# Patient Record
Sex: Male | Born: 1955 | Race: White | Hispanic: No | Marital: Married | Smoking: Former smoker
Health system: Southern US, Community
[De-identification: ages and names within clinical notes are randomized; demographics above are authoritative.]

---

## 1997-10-29 ENCOUNTER — Ambulatory Visit (HOSPITAL_COMMUNITY): Admission: RE | Admit: 1997-10-29 | Discharge: 1997-10-29 | Payer: Self-pay | Admitting: Neurosurgery

## 2004-09-21 ENCOUNTER — Encounter: Admission: RE | Admit: 2004-09-21 | Discharge: 2004-09-21 | Payer: Self-pay | Admitting: Neurosurgery

## 2004-11-16 ENCOUNTER — Encounter: Admission: RE | Admit: 2004-11-16 | Discharge: 2004-11-16 | Payer: Self-pay | Admitting: Neurosurgery

## 2005-11-27 ENCOUNTER — Ambulatory Visit (HOSPITAL_COMMUNITY): Admission: RE | Admit: 2005-11-27 | Discharge: 2005-11-27 | Payer: Self-pay | Admitting: Neurosurgery

## 2006-06-12 ENCOUNTER — Encounter: Admission: RE | Admit: 2006-06-12 | Discharge: 2006-06-12 | Payer: Self-pay | Admitting: Neurosurgery

## 2007-01-14 ENCOUNTER — Encounter: Admission: RE | Admit: 2007-01-14 | Discharge: 2007-01-14 | Payer: Self-pay | Admitting: Neurosurgery

## 2010-04-16 ENCOUNTER — Encounter: Payer: Self-pay | Admitting: Neurosurgery

## 2017-07-02 ENCOUNTER — Other Ambulatory Visit: Payer: Self-pay | Admitting: Family Medicine

## 2017-07-02 DIAGNOSIS — I739 Peripheral vascular disease, unspecified: Secondary | ICD-10-CM

## 2017-07-05 ENCOUNTER — Ambulatory Visit
Admission: RE | Admit: 2017-07-05 | Discharge: 2017-07-05 | Disposition: A | Discharge status: Home / Self Care | Payer: No Typology Code available for payment source | Source: Ambulatory Visit | Attending: Family Medicine | Admitting: Family Medicine

## 2017-07-05 DIAGNOSIS — I739 Peripheral vascular disease, unspecified: Secondary | ICD-10-CM

## 2017-07-08 ENCOUNTER — Ambulatory Visit
Admission: RE | Admit: 2017-07-08 | Discharge: 2017-07-08 | Disposition: A | Discharge status: Home / Self Care | Payer: Self-pay | Source: Ambulatory Visit | Attending: Family Medicine | Admitting: Family Medicine

## 2017-07-08 DIAGNOSIS — I739 Peripheral vascular disease, unspecified: Secondary | ICD-10-CM

## 2017-07-29 DIAGNOSIS — I739 Peripheral vascular disease, unspecified: Secondary | ICD-10-CM

## 2017-07-29 NOTE — H&P (Signed)
Zachary Spears 07/11/2017 2:00 PM Location: [facility] Patient #: [id number] DOB: November 21, 1955 Married / Language: English / Race: White Male   History of Present Illness Zachary Maine FNP-C; 07/11/2017 3:18 PM) Patient words: NP EVAL for L leg arteral blockage.  The patient is a 62 year old male who presents with peripheral vascular disease. [name] is a 62 year old Caucasian male referred by his PCP for evaluation of abnormal lower extremity arterial duplex. Patient has past medical history of sleep apnea on CPAP. Denies any hypertension, hyperlipidemia, diabetes, or thyroid disorders. He is also recurrent 1-1/2 pack-a-day smoker for the last 40 years. He reports over the last year he has had cramping in his left calf with walking and now he is only able to walk 1-200 feet without having to stop to rest due to left leg pain. He recently underwent lower extremity arterial duplex that revealed a left ABI of 0.4, right ABI was normal at 1.0. No ulcerations have been noted or color changes. He reports previously having a stress test done several years ago that he states was normal. He denies any symptoms of chest pain, shortness of breath, edema, PND, orthopnea, or symptoms suggestive of TIA. He was recently given a prescription for Chantix and has recently started this to help aid in his smoking cessation.   Problem List/Past Medical (April Harrington; 07/11/2017 2:10 PM) Laboratory examination (Z01.89)  Blocked artery (I70.90)  lt leg Sleep apnea (G47.30)  OSA on CPAP (G47.33)   Allergies (April Harrington; 07/11/2017 2:03 PM) No Known Drug Allergies [07/11/2017]:  Family History (April Harrington; 07/11/2017 2:07 PM) Mother  In good health. no heart attacks or strokes, no known cardiovascular conditions Father  Deceased. age 31 from pneumonia, cancer Sister 1  younger-good health Brother 1  yoounger-good health, no heart attacks or strokes, no known  cardiovascular conditions  Social History (April Harrington; 07/11/2017 2:04 PM) Current tobacco use  Current every day smoker. 1ppd for about 40 yrs Alcohol Use  Occasional alcohol use, Drinks beer. Marital status  Married. Living Situation  Lives with spouse. Number of Children  2.  Past Surgical History (April Harrington; 07/11/2017 2:06 PM) Stomach Surgery  in the 1970's  Medication History (April Harrington; 07/11/2017 2:14 PM) Ibuprofen (200MG Tablet, Oral prn for knee pain) Active. Medications Reconciled (verbally with pt; no list or medication present)  Diagnostic Studies History (April Harrington; 07/11/2017 2:12 PM) Colonoscopy [2000]: Endoscopy  1970's Sleep Study  late 1980's-sleep apnea w/ cpap Treadmill stress test [2001]: Nuclear stress test [2001]: MRI  Back-about 15 yrs ago    Review of Systems Zachary Maine FNP-C; 07/11/2017 3:15 PM) General Not Present- Appetite Loss and Weight Gain. Respiratory Not Present- Chronic Cough and Wakes up from Sleep Wheezing or Short of Breath. Cardiovascular Present- Claudications (progressively worsening over the last year in left calf). Not Present- Chest Pain, Difficulty Breathing Lying Down, Difficulty Breathing On Exertion, Edema and Orthopnea. Gastrointestinal Not Present- Black, Tarry Stool and Difficulty Swallowing. Musculoskeletal Not Present- Decreased Range of Motion and Muscle Atrophy. Neurological Not Present- Attention Deficit. Psychiatric Not Present- Personality Changes and Suicidal Ideation. Endocrine Not Present- Cold Intolerance and Heat Intolerance. Hematology Not Present- Abnormal Bleeding. All other systems negative  Vitals (April Harrington; 07/11/2017 2:14 PM) 07/11/2017 1:58 PM Weight: 289.25 lb Height: 70in Body Surface Area: 2.44 m Body Mass Index: 41.5 kg/m  Pulse: 78 (Regular)  P.OX: 96% (Room air) BP: 140/72 (Sitting, Left Arm, Standard)       Physical  Exam Zachary Maine FNP-C; 07/11/2017 3:14 PM) General Mental Status-Alert. General Appearance-Cooperative, Appears stated age, Not in acute distress. Build & Nutrition-Well built and Morbidly obese.  Head and Neck Neck -Note: Short neck and difficult to evaluate JVD.  Thyroid Gland Characteristics - no palpable nodules, no palpable enlargement.  Cardiovascular Cardiovascular examination reveals -normal heart sounds, regular rate and rhythm with no murmurs. Inspection Jugular vein - Right - No Distention.  Abdomen Inspection Contour - Obese and Pannus present. Palpation/Percussion Palpation and Percussion of the abdomen reveal - Non Tender and No hepatosplenomegaly. Auscultation Auscultation of the abdomen reveals - Bowel sounds normal.  Peripheral Vascular Lower Extremity Inspection - Bilateral - No Cyanotic nailbeds, No Ulcerations. Palpation - Temperature - Left - Cool. Right - Warm. Edema - Bilateral - No edema. Femoral pulse - Bilateral - Feeble(Pulsus difficult to feel due to patient's bodily habitus.), No Bruits. Popliteal pulse - Bilateral - Feeble(Pulsus difficult to feel due to patient's bodily habitus.). Dorsalis pedis pulse - Left - Absent. Right - 1+. Posterior tibial pulse - Left - 0+. Right - 2+. Carotid arteries - Left-Soft Bruit. Carotid arteries - Right-No Carotid bruit.  Neurologic Neurologic evaluation reveals -alert and oriented x 3 with no impairment of recent or remote memory. Motor-Grossly intact without any focal deficits.  Musculoskeletal Global Assessment Left Lower Extremity - normal range of motion without pain. Right Lower Extremity - normal range of motion without pain.    Assessment & Plan Zachary Maine FNP-C; 07/11/2017 3:21 PM) Intermittent claudication of left lower extremity due to atherosclerosis (I70.212) Story: Bilateral LE duplex 07/08/2017: L ABI 0.45, R ABI: 1.0; Segmental pressures, arterial wave forms, and pulse volume  recordings suggested combination of In-Flow (iliac) and outflow (femoropopliteal) occlusive disease.  EKG 07/11/2017: Normal sinus rhythm at 65 bpm,left atrial enlargement, normal axis, poor R-wave progression cannot exclude anterior infarct old. Low-voltage complexes. No evidence of ischemia. Current Plans Complete electrocardiogram (93000) Started Crestor 20MG, 1 (one) Tablet every evening after dinner, #30, 30 days starting 07/11/2017, Ref. x1. Started Clopidogrel Bisulfate 75MG, 1 (one) Tablet daily, #30, 30 days starting 07/11/2017, Ref. x1. Started Aspirin 81 81MG, 1 (one) Tablet daily, #30, 30 days starting 07/11/2017, No Refill. Started Altace 5MG, 1 (one) Capsule daily in the evening, #30, 30 days starting 07/11/2017, Ref. x1. Left carotid bruit (R09.89) Tobacco use (Z72.0) Story: 80 pack year history; currently working to quit smoking with Chantix Current Plans Started Nicotine Step 1 21MG/24HR, 1 (one) Patch daily for 6 weeks, 30 days starting 07/11/2017, Ref. x1. Local Order: 21 mg for 6 weeks; ;then 14 mg for 2 weeks; then 7 mg for 2 weeks Started Nicotine Step 2 14MG/24HR, 1 (one) Patch daily for 2 weeks, 10 days starting 07/11/2017, No Refill. Local Order: after 21 mg then 7 mg for 2 weeks Started Nicotine Step 3 7MG/24HR, 1 (one) Patch daily for 2 weeks, 10 Patch, 10 days starting 07/11/2017, No Refill. Local Order: after 50m patch Critical lower limb ischemia (I99.8) Laboratory examination ((Y70.62 Story: 07/02/2017: Cholesterol 211, triglycerides 207, HDL 43, LDL 134. Creatinine 0.95, EGFR 85/99, potassium 4.3, CMP normal. Hemoglobin A1c 5.6%. TSH 1.69. CBC normal. Iron studies normal.  Note:. Recommendation:  Patient is referred to [facility] his PCP for evaluation of abnormal lower extremity arterial duplex. Patient has classic claudication symptoms class III and critical limb ischemia and by physical exam. He will need peripheral angiogram with possible intervention.  Although he is without symptoms of coronary ischemia, will need evaluation  for the same given his risk factors and significant PAD. Will schedule for Lexiscan nuclear stress testing prior to procedure to exclude coronary ischemia prior to undergoing the procedure. Unless stress testing is abnormal, we will proceed with the procedure. He will also need carotid duplex as he has right carotid bruit. I have started him on Plavix and daily baby aspirin for management of his PAD. We'll also start him on Altase 5 mg daily for blood pressure management. Given his significant PAD, he will need statin therapy, I have started him on Crestor 20 mg daily. I have advised him to only use ibuprofen as needed due to increased GI bleeding risk.  Lengthy discussion with the patient regarding the importance of smoking cessation and how it is contributed to his PAD He is currently using Chantix to aid with this. I've advised him to start nicotine patches as well as patients tend to have better success with dual medical therapy. He is also morbidly obese and will need significant diet modifications to promote weight loss. I discussed the importance of caloric restriction. We'll see him back after the procedure for further recommendations and evaluation.  *I have discussed this case with Dr. Einar Gip and he personally examined the patient and participated in formulating the plan.*  CC: Dr. Tamsen Roers    Signed by Zachary Maine, FNP-C (07/11/2017 3:22 PM)

## 2017-07-29 NOTE — Progress Notes (Signed)
Labs04/30/2019:  H/H 14.8/42. MCV 90. Platelets 269.  Glucose 106, creatinine 0.94, EGFR 87/100, potassium 4.7, BMP otherwise normal.

## 2017-07-30 ENCOUNTER — Ambulatory Visit (HOSPITAL_COMMUNITY)
Admission: RE | Disposition: A | Discharge status: Home / Self Care | Payer: Self-pay | Source: Ambulatory Visit | Attending: Cardiology

## 2017-07-30 ENCOUNTER — Ambulatory Visit (HOSPITAL_COMMUNITY)
Admission: RE | Admit: 2017-07-30 | Discharge: 2017-07-30 | Disposition: A | Discharge status: Home / Self Care | Payer: Self-pay | Source: Ambulatory Visit | Attending: Cardiology | Admitting: Cardiology

## 2017-07-30 ENCOUNTER — Encounter (HOSPITAL_COMMUNITY): Payer: Self-pay | Admitting: Cardiology

## 2017-07-30 DIAGNOSIS — F17210 Nicotine dependence, cigarettes, uncomplicated: Secondary | ICD-10-CM | POA: Insufficient documentation

## 2017-07-30 DIAGNOSIS — Z79899 Other long term (current) drug therapy: Secondary | ICD-10-CM | POA: Insufficient documentation

## 2017-07-30 DIAGNOSIS — I739 Peripheral vascular disease, unspecified: Secondary | ICD-10-CM

## 2017-07-30 DIAGNOSIS — Z6841 Body Mass Index (BMI) 40.0 and over, adult: Secondary | ICD-10-CM | POA: Insufficient documentation

## 2017-07-30 DIAGNOSIS — I70212 Atherosclerosis of native arteries of extremities with intermittent claudication, left leg: Secondary | ICD-10-CM | POA: Insufficient documentation

## 2017-07-30 DIAGNOSIS — Z9889 Other specified postprocedural states: Secondary | ICD-10-CM | POA: Insufficient documentation

## 2017-07-30 DIAGNOSIS — G4733 Obstructive sleep apnea (adult) (pediatric): Secondary | ICD-10-CM | POA: Insufficient documentation

## 2017-07-30 HISTORY — PX: LOWER EXTREMITY ANGIOGRAPHY: CATH118251

## 2017-07-30 HISTORY — PX: ABDOMINAL AORTOGRAM: CATH118222

## 2017-07-30 HISTORY — PX: PERIPHERAL VASCULAR ATHERECTOMY: CATH118256

## 2017-07-30 LAB — POCT ACTIVATED CLOTTING TIME
ACTIVATED CLOTTING TIME: 252 s
ACTIVATED CLOTTING TIME: 241 s
Activated Clotting Time: 235 seconds
Activated Clotting Time: 246 seconds

## 2017-07-30 SURGERY — LOWER EXTREMITY ANGIOGRAPHY
Anesthesia: LOCAL

## 2017-07-30 MED ORDER — CLOPIDOGREL BISULFATE 300 MG PO TABS
ORAL_TABLET | ORAL | Status: AC
  Filled 2017-07-30: qty 2

## 2017-07-30 MED ORDER — FENTANYL CITRATE (PF) 100 MCG/2ML IJ SOLN
INTRAMUSCULAR | Status: AC
  Filled 2017-07-30: qty 2

## 2017-07-30 MED ORDER — VERAPAMIL HCL 2.5 MG/ML IV SOLN
INTRAVENOUS | Status: AC
  Filled 2017-07-30: qty 2

## 2017-07-30 MED ORDER — NITROGLYCERIN 1 MG/10 ML FOR IR/CATH LAB
INTRA_ARTERIAL | Status: DC | PRN
  Administered 2017-07-30: 300 ug
  Administered 2017-07-30 (×2): 200 ug
  Administered 2017-07-30: 300 mL
  Administered 2017-07-30: 200 ug
  Administered 2017-07-30: 400 ug
  Administered 2017-07-30: 300 ug

## 2017-07-30 MED ORDER — MIDAZOLAM HCL 2 MG/2ML IJ SOLN
INTRAMUSCULAR | Status: AC
  Filled 2017-07-30: qty 2

## 2017-07-30 MED ORDER — SODIUM CHLORIDE 0.9 % IV SOLN: 250.0000 mL | INTRAVENOUS | Status: DC | PRN

## 2017-07-30 MED ORDER — HEPARIN (PORCINE) IN NACL 1000-0.9 UT/500ML-% IV SOLN
INTRAVENOUS | Status: AC
  Filled 2017-07-30: qty 1000

## 2017-07-30 MED ORDER — LIDOCAINE HCL (PF) 1 % IJ SOLN
INTRAMUSCULAR | Status: DC | PRN
  Administered 2017-07-30: 15 mL

## 2017-07-30 MED ORDER — HEPARIN SODIUM (PORCINE) 1000 UNIT/ML IJ SOLN
INTRAMUSCULAR | Status: DC | PRN
  Administered 2017-07-30 (×2): 3000 [IU] via INTRAVENOUS
  Administered 2017-07-30: 2000 [IU] via INTRAVENOUS
  Administered 2017-07-30: 3000 [IU] via INTRAVENOUS
  Administered 2017-07-30: 10000 [IU] via INTRAVENOUS

## 2017-07-30 MED ORDER — NITROGLYCERIN IN D5W 200-5 MCG/ML-% IV SOLN
INTRAVENOUS | Status: AC
  Filled 2017-07-30: qty 250

## 2017-07-30 MED ORDER — ACETAMINOPHEN 325 MG PO TABS: 650.0000 mg | ORAL_TABLET | ORAL | Status: DC | PRN

## 2017-07-30 MED ORDER — FENTANYL CITRATE (PF) 100 MCG/2ML IJ SOLN
INTRAMUSCULAR | Status: DC | PRN
  Administered 2017-07-30 (×4): 50 ug via INTRAVENOUS
  Administered 2017-07-30: 25 ug via INTRAVENOUS

## 2017-07-30 MED ORDER — HEPARIN SODIUM (PORCINE) 1000 UNIT/ML IJ SOLN
INTRAMUSCULAR | Status: AC
  Filled 2017-07-30: qty 1

## 2017-07-30 MED ORDER — LABETALOL HCL 5 MG/ML IV SOLN: 10.0000 mg | INTRAVENOUS | Status: DC | PRN

## 2017-07-30 MED ORDER — HYDRALAZINE HCL 20 MG/ML IJ SOLN: 5.0000 mg | INTRAMUSCULAR | Status: DC | PRN

## 2017-07-30 MED ORDER — SODIUM CHLORIDE 0.9% FLUSH: 3.0000 mL | INTRAVENOUS | Status: DC | PRN

## 2017-07-30 MED ORDER — FENTANYL CITRATE (PF) 100 MCG/2ML IJ SOLN
INTRAMUSCULAR | Status: AC
Start: 2017-07-30 — End: ?
  Filled 2017-07-30: qty 2

## 2017-07-30 MED ORDER — SODIUM CHLORIDE 0.9% FLUSH: 3.0000 mL | Freq: Two times a day (BID) | INTRAVENOUS | Status: DC

## 2017-07-30 MED ORDER — HEPARIN (PORCINE) IN NACL 2-0.9 UNITS/ML
INTRAMUSCULAR | Status: AC | PRN
  Administered 2017-07-30 (×2): 500 mL

## 2017-07-30 MED ORDER — NITROGLYCERIN 0.4 MG SL SUBL
SUBLINGUAL_TABLET | SUBLINGUAL | Status: AC
  Filled 2017-07-30: qty 1

## 2017-07-30 MED ORDER — CLOPIDOGREL BISULFATE 300 MG PO TABS
ORAL_TABLET | ORAL | Status: DC | PRN
  Administered 2017-07-30: 600 mg via ORAL

## 2017-07-30 MED ORDER — SODIUM CHLORIDE 0.9 % WEIGHT BASED INFUSION: 1.0000 mL/kg/h | INTRAVENOUS | Status: DC

## 2017-07-30 MED ORDER — MIDAZOLAM HCL 2 MG/2ML IJ SOLN
INTRAMUSCULAR | Status: DC | PRN
  Administered 2017-07-30: 1 mg via INTRAVENOUS
  Administered 2017-07-30: 2 mg via INTRAVENOUS

## 2017-07-30 MED ORDER — SODIUM CHLORIDE 0.9 % IV SOLN
INTRAVENOUS | Status: AC
  Administered 2017-07-30: 06:00:00 via INTRAVENOUS

## 2017-07-30 MED ORDER — ONDANSETRON HCL 4 MG/2ML IJ SOLN: 4.0000 mg | Freq: Four times a day (QID) | INTRAMUSCULAR | Status: DC | PRN

## 2017-07-30 MED ORDER — LIDOCAINE HCL (PF) 1 % IJ SOLN
INTRAMUSCULAR | Status: AC
  Filled 2017-07-30: qty 30

## 2017-07-30 MED ORDER — IODIXANOL 320 MG/ML IV SOLN
INTRAVENOUS | Status: DC | PRN
  Administered 2017-07-30: 215 mL via INTRAVENOUS

## 2017-07-30 SURGICAL SUPPLY — 35 items
BALLN COYOTE OTW 1.5X40X150 (BALLOONS) ×3
BALLN IN.PACT DCB 6X60 (BALLOONS) ×3
BALLN LUTONIX AV 8X40X75 (BALLOONS) ×3
BALLOON COYOTE OTW 1.5X40X150 (BALLOONS) ×2 IMPLANT
BALLOON LUTONIX AV 8X40X75 (BALLOONS) ×2 IMPLANT
CATH CXI SUPP ANG 2.6FR 150CM (CATHETERS) ×3 IMPLANT
CATH CXI SUPP ST 4FR 135CM (CATHETERS) ×3 IMPLANT
CATH OMNI FLUSH 5F 65CM (CATHETERS) ×3 IMPLANT
COVER PRB 48X5XTLSCP FOLD TPE (BAG) ×2 IMPLANT
COVER PROBE 5X48 (BAG) ×1
DCB IN.PACT 6X60 (BALLOONS) ×2 IMPLANT
DEVICE CLOSURE MYNXGRIP 6/7F (Vascular Products) ×3 IMPLANT
DEVICE EMBOSHIELD NAV6 4.0-7.0 (FILTER) ×3 IMPLANT
DIAMONDBACK SOLID OAS 2.0MM (CATHETERS) ×3
GLIDEWIRE NITREX 0.018X80X5 (WIRE) ×1
GUIDEWIRE ANGLED .035X150CM (WIRE) ×3 IMPLANT
GUIDEWIRE NITREX 0.018X80X5 (WIRE) ×2 IMPLANT
KIT ENCORE 26 ADVANTAGE (KITS) ×3 IMPLANT
KIT MICROPUNCTURE NIT STIFF (SHEATH) ×3 IMPLANT
KIT PV (KITS) ×3 IMPLANT
LUBRICANT VIPERSLIDE CORONARY (MISCELLANEOUS) ×6 IMPLANT
SHEATH PINNACLE 5F 10CM (SHEATH) ×3 IMPLANT
SHEATH PINNACLE 7F 10CM (SHEATH) ×3 IMPLANT
SHEATH PINNACLE ST 7F 45CM (SHEATH) ×3 IMPLANT
STOPCOCK MORSE 400PSI 3WAY (MISCELLANEOUS) ×3 IMPLANT
SYRINGE MEDRAD AVANTA MACH 7 (SYRINGE) ×3 IMPLANT
SYSTEM DIMNDBCK SLD OAS 2.0MM (CATHETERS) ×2 IMPLANT
TRANSDUCER W/STOPCOCK (MISCELLANEOUS) ×3 IMPLANT
TRAY PV CATH (CUSTOM PROCEDURE TRAY) ×3 IMPLANT
TUBING CIL FLEX 10 FLL-RA (TUBING) ×3 IMPLANT
WIRE HI TORQ COMMND ES.014X300 (WIRE) ×3 IMPLANT
WIRE HITORQ VERSACORE ST 145CM (WIRE) ×3 IMPLANT
WIRE TORQFLEX AUST .018X40CM (WIRE) ×3 IMPLANT
WIRE VERSACORE LOC 115CM (WIRE) ×3 IMPLANT
WIRE VIPER ADVANCE .017X335CM (WIRE) ×3 IMPLANT

## 2017-07-30 NOTE — Interval H&P Note (Signed)
History and Physical Interval Note:  07/30/2017 7:23 AM  Zachary Spears  has presented today for surgery, with the diagnosis of PAD  The various methods of treatment have been discussed with the patient and family. After consideration of risks, benefits and other options for treatment, the patient has consented to  Procedure(s): LOWER EXTREMITY ANGIOGRAPHY (Left) as a surgical intervention .  The patient's history has been reviewed, patient examined, no change in status, stable for surgery.  I have reviewed the patient's chart and labs.  Questions were answered to the patient's satisfaction.     Manish J Patwardhan

## 2017-07-30 NOTE — Discharge Instructions (Signed)
**Note Zachary Spears-identified via Obfuscation** Femoral Site Care °Refer to this sheet in the next few weeks. These instructions provide you with information about caring for yourself after your procedure. Your health care provider may also give you more specific instructions. Your treatment has been planned according to current medical practices, but problems sometimes occur. Call your health care provider if you have any problems or questions after your procedure. °What can I expect after the procedure? °After your procedure, it is typical to have the following: °· Bruising at the site that usually fades within 1-2 weeks. °· Blood collecting in the tissue (hematoma) that may be painful to the touch. It should usually decrease in size and tenderness within 1-2 weeks. ° °Follow these instructions at home: °· Take medicines only as directed by your health care provider. °· You may shower 24-48 hours after the procedure or as directed by your health care provider. Remove the bandage (dressing) and gently wash the site with plain soap and water. Pat the area dry with a clean towel. Do not rub the site, because this may cause bleeding. °· Do not take baths, swim, or use a hot tub until your health care provider approves. °· Check your insertion site every day for redness, swelling, or drainage. °· Do not apply powder or lotion to the site. °· Limit use of stairs to twice a day for the first 2-3 days or as directed by your health care provider. °· Do not squat for the first 2-3 days or as directed by your health care provider. °· Do not lift over 10 lb (4.5 kg) for 5 days after your procedure or as directed by your health care provider. °· Ask your health care provider when it is okay to: °? Return to work or school. °? Resume usual physical activities or sports. °? Resume sexual activity. °· Do not drive home if you are discharged the same day as the procedure. Have someone else drive you. °· You may drive 24 hours after the procedure unless otherwise instructed by  your health care provider. °· Do not operate machinery or power tools for 24 hours after the procedure or as directed by your health care provider. °· If your procedure was done as an outpatient procedure, which means that you went home the same day as your procedure, a responsible adult should be with you for the first 24 hours after you arrive home. °· Keep all follow-up visits as directed by your health care provider. This is important. °Contact a health care provider if: °· You have a fever. °· You have chills. °· You have increased bleeding from the site. Hold pressure on the site. °Get help right away if: °· You have unusual pain at the site. °· You have redness, warmth, or swelling at the site. °· You have drainage (other than a small amount of blood on the dressing) from the site. °· The site is bleeding, and the bleeding does not stop after 30 minutes of holding steady pressure on the site. °· Your leg or foot becomes pale, cool, tingly, or numb. °This information is not intended to replace advice given to you by your health care provider. Make sure you discuss any questions you have with your health care provider. °Document Released: 11/13/2013 Document Revised: 08/18/2015 Document Reviewed: 09/29/2013 °Elsevier Interactive Patient Education © 2018 [employer] Inc[employer] ° °

## 2017-07-30 NOTE — Progress Notes (Addendum)
**Note Leslee Suire-Identified via Obfuscation** Patient post lower extremity angiography. No complications with surgical site. Patient denies any pain or shortness of breath. Patient's urine very dark tea color. Dr. Jacinto Halim notified by this RN of patient's urine color/appearance. Patient instructed to drink plenty of water and follow-up with Dr. Jacinto Halim tomorrow if urine continues to be very dark tea colored. Patient and wife states understanding of discharge instructions. Fall/safety precautions implemented.

## 2017-07-31 MED FILL — Nitroglycerin IV Soln 200 MCG/ML in D5W: INTRAVENOUS | Qty: 250 | Status: AC

## 2017-07-31 MED FILL — Heparin Sod (Porcine)-NaCl IV Soln 1000 Unit/500ML-0.9%: INTRAVENOUS | Qty: 1000 | Status: AC

## 2017-07-31 MED FILL — Verapamil HCl IV Soln 2.5 MG/ML: INTRAVENOUS | Qty: 4 | Status: AC

## 2019-07-07 IMAGING — US US EXTREM LOW VENOUS*L*
1 series · 13 of 24 positions shown · non-contrast
Comparison: None.

CLINICAL DATA: Left lower extremity pain. History of smoking.
Evaluate for DVT.



[Series 1: us extrem low venous*left* · 0.07mm/px · 13 of 27 slices shown]
[im 1/27]
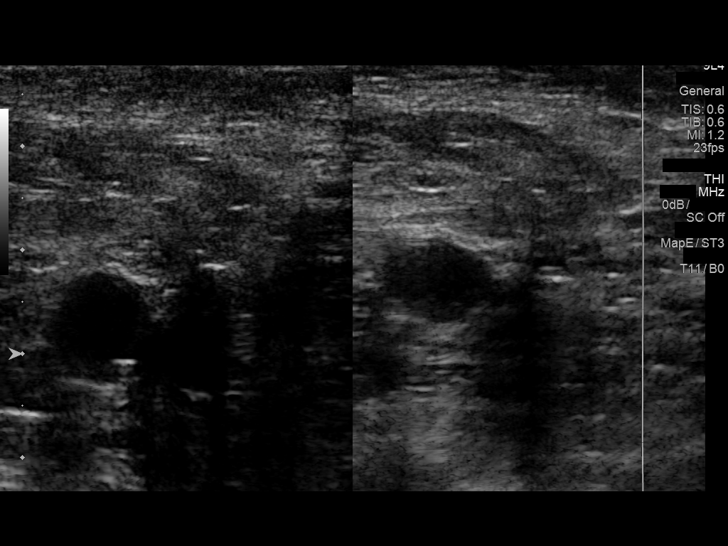
[im 3/27]
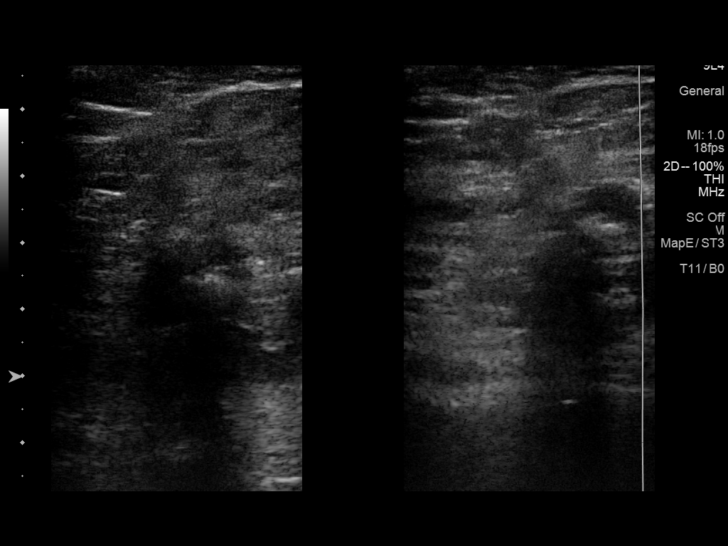
[im 5/27]
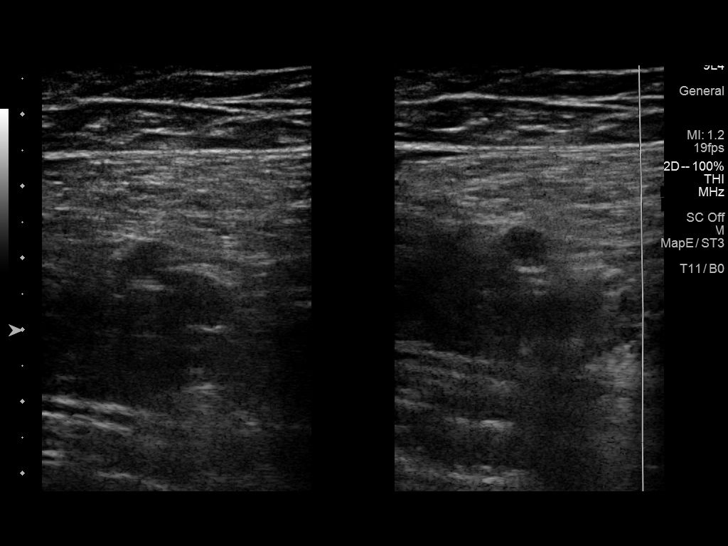
[im 7/27]
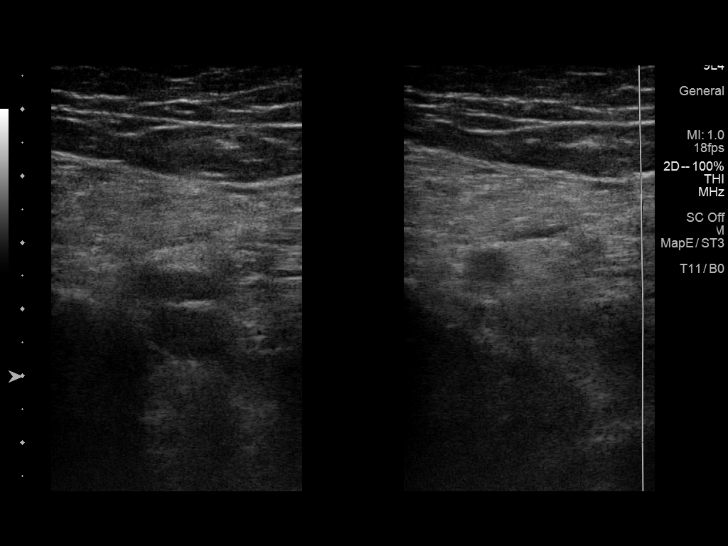
[im 10/27]
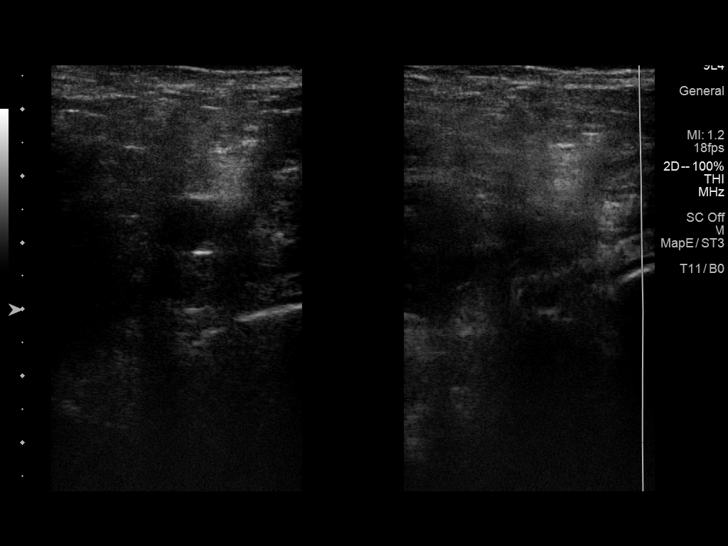
[im 12/27]
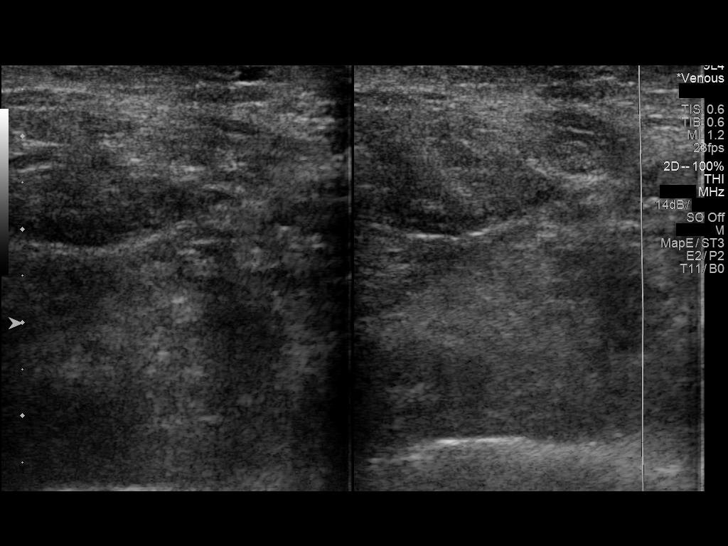
[im 14/27]
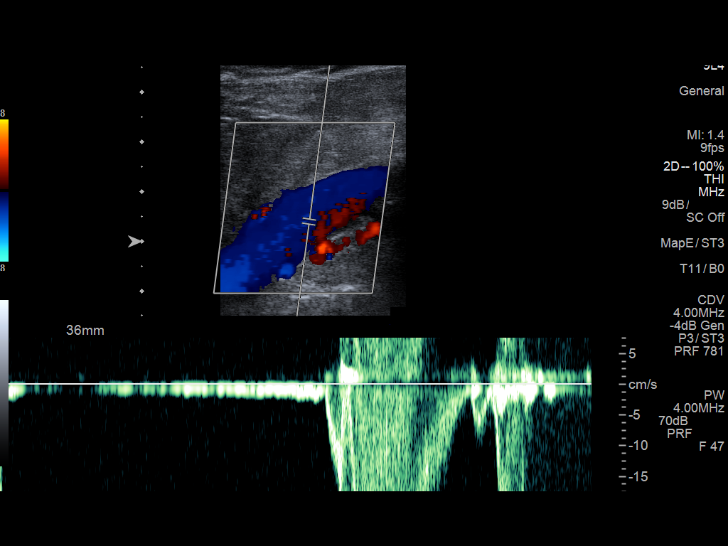
[im 15/27]
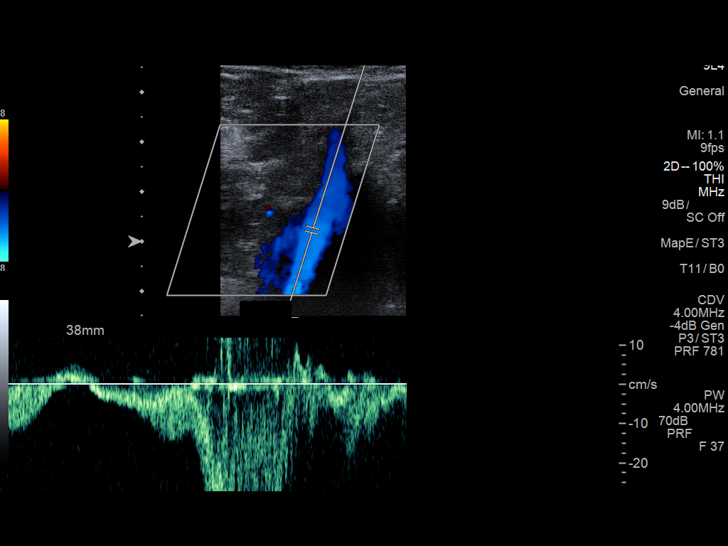
[im 17/27]
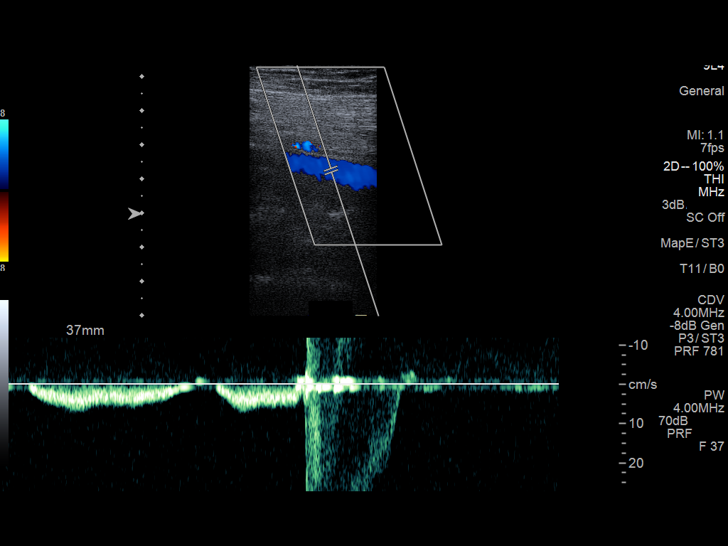
[im 20/27]
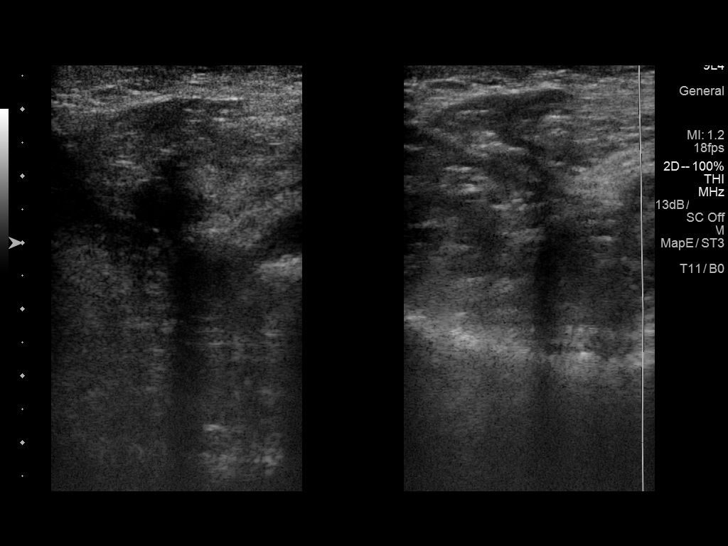
[im 22/27]
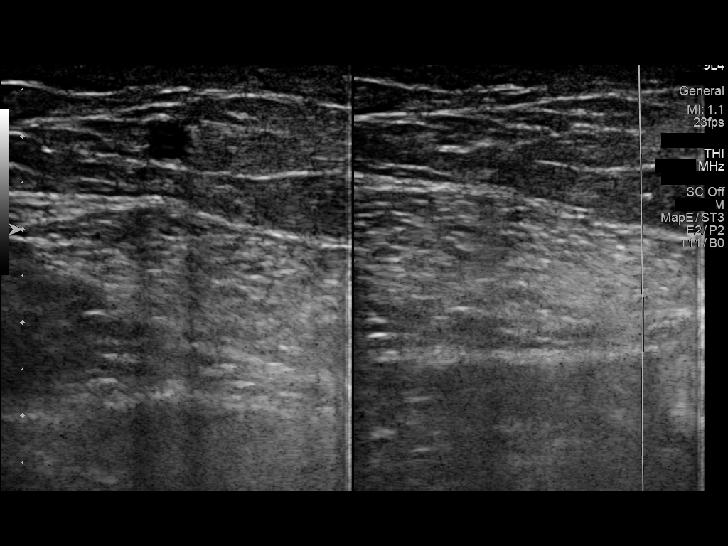
[im 24/27]
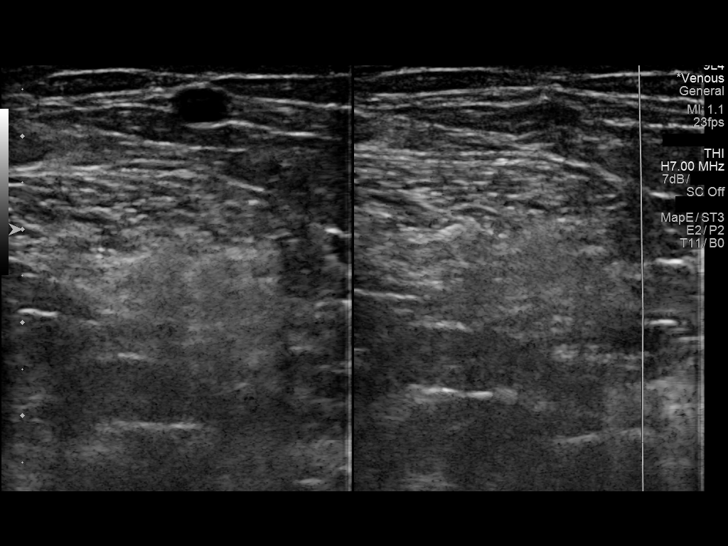
[im 27/27]
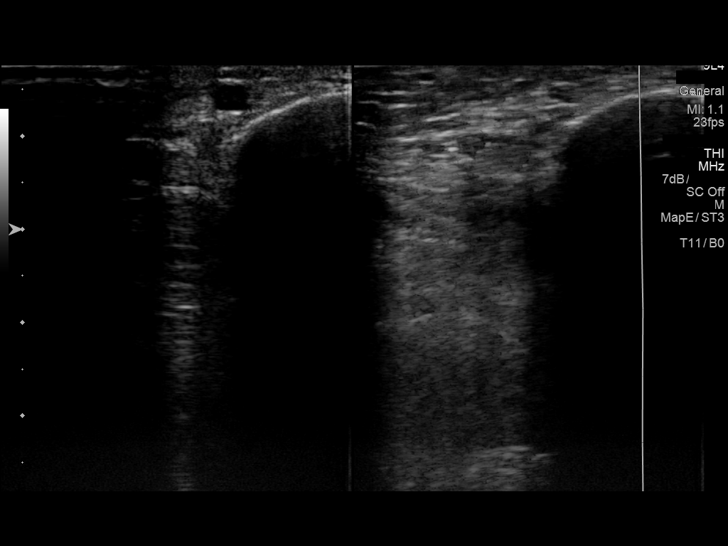

[13 of 24 positions shown; findings below may reference images not displayed]

FINDINGS: Contralateral Common Femoral Vein: Respiratory phasicity is normal
and symmetric with the symptomatic side. No evidence of thrombus.
Normal compressibility.

Common Femoral Vein: No evidence of thrombus. Normal
compressibility, respiratory phasicity and response to augmentation.

Saphenofemoral Junction: No evidence of thrombus. Normal
compressibility and flow on color Doppler imaging.

Profunda Femoral Vein: No evidence of thrombus. Normal
compressibility and flow on color Doppler imaging.

Femoral Vein: No evidence of thrombus. Normal compressibility,
respiratory phasicity and response to augmentation.

Popliteal Vein: No evidence of thrombus. Normal compressibility,
respiratory phasicity and response to augmentation.

Calf Veins: No evidence of thrombus. Normal compressibility and flow
on color Doppler imaging.

Superficial Great Saphenous Vein: No evidence of thrombus. Normal
compressibility.

Venous Reflux:  None.

Other Findings:  None.
IMPRESSION: No evidence of DVT within the left lower extremity.

## 2022-11-21 ENCOUNTER — Ambulatory Visit (INDEPENDENT_AMBULATORY_CARE_PROVIDER_SITE_OTHER): Payer: Self-pay | Admitting: Behavioral Health

## 2022-11-21 ENCOUNTER — Encounter: Payer: Self-pay | Admitting: Behavioral Health

## 2022-11-21 VITALS — BP 139/69 | HR 67 | Ht 68.0 in | Wt 280.0 lb

## 2022-11-21 DIAGNOSIS — F411 Generalized anxiety disorder: Secondary | ICD-10-CM

## 2022-11-21 DIAGNOSIS — R5382 Chronic fatigue, unspecified: Secondary | ICD-10-CM

## 2022-11-21 DIAGNOSIS — F331 Major depressive disorder, recurrent, moderate: Secondary | ICD-10-CM

## 2022-11-21 MED ORDER — BUPROPION HCL ER (XL) 300 MG PO TB24: 300.0000 mg | ORAL_TABLET | Freq: Every day | ORAL | 1 refills | Status: DC

## 2022-11-21 MED ORDER — ALPRAZOLAM 0.5 MG PO TABS: 0.5000 mg | ORAL_TABLET | Freq: Every evening | ORAL | 3 refills | Status: DC | PRN
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
## 2022-11-23 LAB — LIPID PANEL
Cholesterol: 198 mg/dL (ref <200)
HDL: 36 mg/dL — ABNORMAL LOW (ref >=40)
LDL Cholesterol (Calc): 124 mg/dL — ABNORMAL HIGH
Non-HDL Cholesterol (Calc): 162 mg/dL — ABNORMAL HIGH (ref <130)
Total CHOL/HDL Ratio: 5.5 (calc) — ABNORMAL HIGH (ref <5.0)
Triglycerides: 236 mg/dL — ABNORMAL HIGH (ref <150)

## 2022-11-23 LAB — HEMOGLOBIN A1C
Hgb A1c MFr Bld: 6.4 %{Hb} — ABNORMAL HIGH (ref <5.7)
Mean Plasma Glucose: 137 mg/dL
eAG (mmol/L): 7.6 mmol/L

## 2022-11-23 LAB — COMPREHENSIVE METABOLIC PANEL
AG Ratio: 1.8 (calc) (ref 1.0–2.5)
ALT: 23 U/L (ref 9–46)
AST: 18 U/L (ref 10–35)
Albumin: 4.2 g/dL (ref 3.6–5.1)
Alkaline phosphatase (APISO): 101 U/L (ref 35–144)
BUN: 12 mg/dL (ref 7–25)
CO2: 27 mmol/L (ref 20–32)
Calcium: 9.4 mg/dL (ref 8.6–10.3)
Chloride: 102 mmol/L (ref 98–110)
Creat: 0.99 mg/dL (ref 0.70–1.35)
Globulin: 2.4 g/dL (ref 1.9–3.7)
Glucose, Bld: 126 mg/dL — ABNORMAL HIGH (ref 65–99)
Potassium: 5 mmol/L (ref 3.5–5.3)
Sodium: 138 mmol/L (ref 135–146)
Total Bilirubin: 0.4 mg/dL (ref 0.2–1.2)
Total Protein: 6.6 g/dL (ref 6.1–8.1)

## 2022-11-23 LAB — CBC WITH DIFFERENTIAL/PLATELET
Absolute Monocytes: 480 {cells}/uL (ref 200–950)
Basophils Absolute: 60 {cells}/uL (ref 0–200)
Basophils Relative: 0.8 %
Eosinophils Absolute: 173 {cells}/uL (ref 15–500)
Eosinophils Relative: 2.3 %
HCT: 43.2 % (ref 38.5–50.0)
Hemoglobin: 14.7 g/dL (ref 13.2–17.1)
Lymphs Abs: 1688 {cells}/uL (ref 850–3900)
MCH: 30.9 pg (ref 27.0–33.0)
MCHC: 34 g/dL (ref 32.0–36.0)
MCV: 90.9 fL (ref 80.0–100.0)
MPV: 10.5 fL (ref 7.5–12.5)
Monocytes Relative: 6.4 %
Neutro Abs: 5100 {cells}/uL (ref 1500–7800)
Neutrophils Relative %: 68 %
Platelets: 305 10*3/uL (ref 140–400)
RBC: 4.75 10*6/uL (ref 4.20–5.80)
RDW: 13 % (ref 11.0–15.0)
Total Lymphocyte: 22.5 %
WBC: 7.5 10*3/uL (ref 3.8–10.8)

## 2022-11-23 LAB — HEPATIC FUNCTION PANEL
AG Ratio: 1.8 (calc) (ref 1.0–2.5)
ALT: 23 U/L (ref 9–46)
AST: 18 U/L (ref 10–35)
Albumin: 4.2 g/dL (ref 3.6–5.1)
Alkaline phosphatase (APISO): 101 U/L (ref 35–144)
Bilirubin, Direct: 0.1 mg/dL (ref 0.0–0.2)
Globulin: 2.4 g/dL (ref 1.9–3.7)
Indirect Bilirubin: 0.3 mg/dL (ref 0.2–1.2)
Total Bilirubin: 0.4 mg/dL (ref 0.2–1.2)
Total Protein: 6.6 g/dL (ref 6.1–8.1)

## 2022-11-23 LAB — TSH: TSH: 1.28 m[IU]/L (ref 0.40–4.50)

## 2022-11-23 LAB — TESTOSTERONE: Testosterone: 415 ng/dL (ref 250–827)

## 2023-05-16 ENCOUNTER — Other Ambulatory Visit: Payer: Self-pay | Admitting: Behavioral Health

## 2023-05-16 DIAGNOSIS — F331 Major depressive disorder, recurrent, moderate: Secondary | ICD-10-CM

## 2023-05-16 DIAGNOSIS — F411 Generalized anxiety disorder: Secondary | ICD-10-CM

## 2023-05-16 NOTE — Telephone Encounter (Signed)
 Please schedule pt an appt [facility] 08/28 due back in 6 weeks.

## 2023-05-17 NOTE — Telephone Encounter (Signed)
 Patient scheduled 2/25.

## 2023-05-21 ENCOUNTER — Ambulatory Visit (INDEPENDENT_AMBULATORY_CARE_PROVIDER_SITE_OTHER): Payer: Self-pay | Admitting: Behavioral Health

## 2023-05-21 ENCOUNTER — Encounter: Payer: Self-pay | Admitting: Behavioral Health

## 2023-05-21 VITALS — BP 140/79 | HR 92 | Ht 68.0 in | Wt 267.0 lb

## 2023-05-21 DIAGNOSIS — R5382 Chronic fatigue, unspecified: Secondary | ICD-10-CM

## 2023-05-21 DIAGNOSIS — Z79899 Other long term (current) drug therapy: Secondary | ICD-10-CM

## 2023-05-21 DIAGNOSIS — F411 Generalized anxiety disorder: Secondary | ICD-10-CM

## 2023-05-21 DIAGNOSIS — F331 Major depressive disorder, recurrent, moderate: Secondary | ICD-10-CM

## 2023-05-21 MED ORDER — BUPROPION HCL ER (XL) 300 MG PO TB24: 300.0000 mg | ORAL_TABLET | Freq: Every day | ORAL | 1 refills | Status: DC

## 2023-05-21 MED ORDER — ALPRAZOLAM 0.5 MG PO TABS: 0.5000 mg | ORAL_TABLET | Freq: Every evening | ORAL | 3 refills | Status: DC | PRN

## 2023-05-21 NOTE — Progress Notes (Signed)
 [facility]  Patient ID: Zachary Spears,  MRN: [id number]  PCP: Aida Puffer, MD  Date of Evaluation: 05/21/2023 Time spent:40 minutes  Chief Complaint:  Chief Complaint   Anxiety; Depression; Follow-up; Medication Refill; Patient Education; Fatigue; Agitation     HISTORY/CURRENT STATUS: HPI  "Zachary Spears", 68 year old male presents to this office for follow up and medication management. Collateral information should be considered reliable.  Still complaining of intense fatigue. He understand eh is overweight and holding in primarily abdominal area. Admits to effecting breathing and suspect sleep apnea. Still has not found PCP as highly recommended. Not had physical in years. Does not believe his A1c is factor  and says that he eats well on road but not at home. Wants to recheck after he get back from trip. Says that he feels depressed and rates depression 4/10 and anxiety at 4/10.  Believes that Wellbutrin is helping somewhat but still irritable and easily agitated. He also endorses lack of motivation, frequent irritability, and chronic fatigue.  He has reduced activity and no longer has the desire for his normal hobbies.  Altered gait due to knee pain bilat. Says he will not consider any surgical intervention. Says that he does sleep 7 hours per night.   He denies any history of mania, no psychosis, no auditory or visual hallucinations.  No SI or HI.  Patient states that he feels safe and verbally contracts for safety with this Clinical research associate.   Past psychiatric medication trials: Wellbutrin Xanax          Individual Medical History/ Review of Systems: Changes? :No   Allergies: Patient has no known allergies.  Current Medications:  Current Outpatient Medications:    ALPRAZolam (XANAX) 0.5 MG tablet, Take 1 tablet (0.5 mg total) by mouth at bedtime as needed for anxiety., Disp: 60 tablet, Rfl: 3   aspirin 81 MG tablet, Take 81 mg by mouth daily., Disp: , Rfl: 0   buPROPion  (WELLBUTRIN XL) 300 MG 24 hr tablet, Take 1 tablet (300 mg total) by mouth daily., Disp: 90 tablet, Rfl: 1   clopidogrel (PLAVIX) 75 MG tablet, Take 75 mg by mouth daily., Disp: , Rfl: 1   ramipril (ALTACE) 5 MG capsule, Take 5 mg by mouth at bedtime., Disp: , Rfl: 1   rosuvastatin (CRESTOR) 20 MG tablet, Take 20 mg by mouth at bedtime., Disp: , Rfl: 1 Medication Side Effects: none  Family Medical/ Social History: Changes? No  MENTAL HEALTH EXAM:  Blood pressure (!) 140/79, pulse 92, height 5\' 8"  (1.727 m), weight 267 lb (121.1 kg).Body mass index is 40.6 kg/m.  General Appearance: Casual, Neat, and Well Groomed  Eye Contact:  NA  Speech:  Clear and Coherent  Volume:  Normal  Mood:  NA  Affect:  Appropriate  Thought Process:  Coherent  Orientation:  Full (Time, Place, and Person)  Thought Content: Logical   Suicidal Thoughts:  No  Homicidal Thoughts:  No  Memory:  WNL  Judgement:  Good  Insight:  Good  Psychomotor Activity:  Normal  Concentration:  Concentration: Good  Recall:  Good  Fund of Knowledge: Good  Language: Good  Assets:  Desire for Improvement  ADL's:  Intact  Cognition: WNL  Prognosis:  Good    DIAGNOSES:    ICD-10-CM   1. Major depressive disorder, recurrent episode, moderate (HCC)  F33.1 buPROPion (WELLBUTRIN XL) 300 MG 24 hr tablet    2. Chronic fatigue  R53.82 Hemoglobin A1c    3. Generalized anxiety  disorder  F41.1 buPROPion (WELLBUTRIN XL) 300 MG 24 hr tablet    ALPRAZolam (XANAX) 0.5 MG tablet    4. High risk medication use  Z79.899 Hemoglobin A1c      Receiving Psychotherapy: No    RECOMMENDATIONS:  Greater than 50% of 40 min face to face time with patient was spent on counseling and coordination of care. We reviewed current stability. Very focused on Chronic fatigue. Reluctant to admit physiological issues could be problematic such as obesity. Respiratory and possible sleep apnea. Also A1C was prediabetic. Counseled him on importance of  having PCP. I explained that we need to rule out physiological reasons for fatigue and due to age he needs regular check ups.   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  We talked about his previous plan of care and medication trials.   We agreed today to: To continue Xanax 0.5 mg twice daily as needed for severe anxiety To continue  Wellbutrin 300 mg daily in the am Will report worsening symptoms or side effects promptly Provided emergency contact information Will follow-up in 12 weeks to reassess Ordered labs today: Testosterone, CBC with differential, CMP, LFT, A1c, lipid panel. Provided patient with information to find new PCP.  It has been over 1 year since patient has had checkup. Reviewed PDMP      Joan Flores, NP

## 2023-08-20 ENCOUNTER — Ambulatory Visit: Payer: Self-pay | Admitting: Behavioral Health

## 2024-01-04 ENCOUNTER — Other Ambulatory Visit: Payer: Self-pay | Admitting: Behavioral Health

## 2024-01-04 DIAGNOSIS — F411 Generalized anxiety disorder: Secondary | ICD-10-CM

## 2024-01-04 DIAGNOSIS — F331 Major depressive disorder, recurrent, moderate: Secondary | ICD-10-CM

## 2024-01-07 ENCOUNTER — Other Ambulatory Visit: Payer: Self-pay | Admitting: Behavioral Health

## 2024-01-07 ENCOUNTER — Ambulatory Visit (INDEPENDENT_AMBULATORY_CARE_PROVIDER_SITE_OTHER): Payer: Self-pay | Admitting: Behavioral Health

## 2024-01-07 ENCOUNTER — Encounter: Payer: Self-pay | Admitting: Behavioral Health

## 2024-01-07 DIAGNOSIS — F411 Generalized anxiety disorder: Secondary | ICD-10-CM

## 2024-01-07 DIAGNOSIS — R5382 Chronic fatigue, unspecified: Secondary | ICD-10-CM

## 2024-01-07 DIAGNOSIS — F331 Major depressive disorder, recurrent, moderate: Secondary | ICD-10-CM

## 2024-01-07 MED ORDER — ALPRAZOLAM 0.5 MG PO TABS: 0.5000 mg | ORAL_TABLET | Freq: Every evening | ORAL | 5 refills | Status: AC | PRN

## 2024-01-07 MED ORDER — BUPROPION HCL ER (XL) 150 MG PO TB24: 150.0000 mg | ORAL_TABLET | Freq: Every day | ORAL | 1 refills | Status: DC

## 2024-01-07 MED ORDER — BUPROPION HCL ER (XL) 300 MG PO TB24: 300.0000 mg | ORAL_TABLET | Freq: Every day | ORAL | 1 refills | Status: AC

## 2024-01-07 NOTE — Progress Notes (Signed)
 [facility]  Patient ID: RAHEIM BEUTLER,  MRN: [id number]  PCP: Morgan Agent, MD  Date of Evaluation: 01/07/2024 Time spent:30 minutes  Chief Complaint:   HISTORY/CURRENT STATUS: HPI  Zachary Spears, 68 year old male presents to this office for follow up and medication management.  Patient states that he has, been holding my own.  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 Still has not found PCP as highly recommended to follow up reference blood sugars. Not had physical in years. Provided information to follow up with a PCP. Still carrying around a lot of additional weight. He understands this also is effecting his knee problems and increasing pain.  Says that he feels depressed and rates depression 4/10 and anxiety at 4/10.  He would like to consider increasing his Wellbutrin  if possible because it has worked in the past . He also endorses lack of motivation, frequent irritability, and chronic fatigue. He understands that additional weight and reduced mobility also can cause more fatigue.   Says that he does sleep 7 hours per night.  Wears CPAP as prescribed.  He denies any history of mania, no psychosis, no auditory or visual hallucinations.  No SI or HI.  Patient states that he feels safe and verbally contracts for safety with this Clinical research associate.   Past psychiatric medication trials: Wellbutrin  Xanax     Individual Medical History/ Review of Systems: Changes? :No   Allergies: Patient has no known allergies.  Current Medications:  Current Outpatient Medications:    buPROPion  (WELLBUTRIN  XL) 150 MG 24 hr tablet, Take 1 tablet (150 mg total) by mouth daily., Disp: 30 tablet, Rfl: 1   ALPRAZolam  (XANAX ) 0.5 MG tablet, Take 1 tablet (0.5 mg total) by mouth at bedtime as needed for anxiety., Disp: 60 tablet, Rfl: 5   aspirin  81 MG tablet, Take 81 mg by mouth daily., Disp: , Rfl: 0   buPROPion  (WELLBUTRIN  XL) 300 MG 24 hr tablet, Take 1 tablet (300 mg total) by mouth  daily., Disp: 90 tablet, Rfl: 1   clopidogrel  (PLAVIX ) 75 MG tablet, Take 75 mg by mouth daily., Disp: , Rfl: 1   ramipril (ALTACE) 5 MG capsule, Take 5 mg by mouth at bedtime., Disp: , Rfl: 1   rosuvastatin (CRESTOR) 20 MG tablet, Take 20 mg by mouth at bedtime., Disp: , Rfl: 1 Medication Side Effects: none  Family Medical/ Social History: Changes? No  MENTAL HEALTH EXAM:  There were no vitals taken for this visit.There is no height or weight on file to calculate BMI.  General Appearance: Casual and Well Groomed  Eye Contact:  Good  Speech:  Clear and Coherent  Volume:  Normal  Mood:  Anxious and Depressed  Affect:  Congruent, Depressed, and Anxious  Thought Process:  Coherent  Orientation:  Full (Time, Place, and Person)  Thought Content: Logical   Suicidal Thoughts:  No  Homicidal Thoughts:  No  Memory:  WNL  Judgement:  Good  Insight:  Good  Psychomotor Activity:  Normal  Concentration:  Concentration: Good  Recall:  Good  Fund of Knowledge: Good  Language: Good  Assets:  Desire for Improvement  ADL's:  Intact  Cognition: WNL  Prognosis:  Good    DIAGNOSES:    ICD-10-CM   1. Major depressive disorder, recurrent episode, moderate (HCC)  F33.1 buPROPion  (WELLBUTRIN  XL) 150 MG 24 hr tablet    buPROPion  (WELLBUTRIN  XL) 300 MG 24 hr tablet    2. Generalized anxiety disorder  F41.1 buPROPion  (WELLBUTRIN  XL)  150 MG 24 hr tablet    buPROPion  (WELLBUTRIN  XL) 300 MG 24 hr tablet    ALPRAZolam  (XANAX ) 0.5 MG tablet    3. Chronic fatigue  R53.82 buPROPion  (WELLBUTRIN  XL) 150 MG 24 hr tablet      Receiving Psychotherapy: No    RECOMMENDATIONS:    Greater than 50% of 30  min face to face time with patient was spent on counseling and coordination of care. We reviewed current stability. Continues with some moderate depression with fatigue.  He acknowledges that weight could be responsible for fatigue, increased blood sugars, and worsening knee pain.  Reinforced to him  importance of having PCP.  Provided him with local information to help with finding a provider.  I stressed to him that many of these problems were only going to get worse if we did not make some changes promptly.  I reviewed with him the benefits of GLP 1 medications.  I explained that once he established with a PCP this would be a good topic of conversation.  I feel that he would be a strong candidate for this medication considering he has lifestyle as a truck driver with minimal opportunity for exercise and physical activity.  His knee pain is further complicating him being proactive in reducing his weight.    We agreed today to: To continue Xanax  0.5 mg twice daily as needed for severe anxiety Will increase Wellbutrin  450 mg daily in the am. RX two separate scripts. 150 mg XR and 300 mg XR. Will report worsening symptoms or side effects promptly Provided emergency contact information Will follow-up in 6 months to reassess.  Provided patient with information to find new PCP. Highly encourage him to schedule appt as soon as possible.   Reviewed PDMP    Redell DELENA Pizza, NP

## 2024-01-30 ENCOUNTER — Other Ambulatory Visit: Payer: Self-pay | Admitting: Behavioral Health

## 2024-01-30 DIAGNOSIS — F331 Major depressive disorder, recurrent, moderate: Secondary | ICD-10-CM

## 2024-01-30 DIAGNOSIS — R5382 Chronic fatigue, unspecified: Secondary | ICD-10-CM

## 2024-01-30 DIAGNOSIS — F411 Generalized anxiety disorder: Secondary | ICD-10-CM

## 2024-07-07 ENCOUNTER — Ambulatory Visit: Payer: Self-pay | Admitting: Behavioral Health
